# Patient Record
Sex: Male | Born: 1995 | Race: White | Hispanic: No | Marital: Single
Health system: Southern US, Community
[De-identification: ages and names within clinical notes are randomized; demographics above are authoritative.]

## PROBLEM LIST (undated history)

## (undated) DIAGNOSIS — F32A Depression, unspecified: Secondary | ICD-10-CM

## (undated) DIAGNOSIS — F419 Anxiety disorder, unspecified: Secondary | ICD-10-CM

## (undated) DIAGNOSIS — F329 Major depressive disorder, single episode, unspecified: Secondary | ICD-10-CM

---

## 2017-05-25 ENCOUNTER — Emergency Department
Admission: EM | Admit: 2017-05-25 | Discharge: 2017-05-25 | Disposition: A | Discharge status: Home / Self Care | Payer: BLUE CROSS/BLUE SHIELD | Attending: Emergency Medicine | Admitting: Emergency Medicine

## 2017-05-25 ENCOUNTER — Emergency Department: Payer: BLUE CROSS/BLUE SHIELD

## 2017-05-25 DIAGNOSIS — R0789 Other chest pain: Secondary | ICD-10-CM | POA: Insufficient documentation

## 2017-05-25 DIAGNOSIS — R079 Chest pain, unspecified: Secondary | ICD-10-CM | POA: Diagnosis present

## 2017-05-25 DIAGNOSIS — F419 Anxiety disorder, unspecified: Secondary | ICD-10-CM

## 2017-05-25 HISTORY — DX: Depression, unspecified: F32.A

## 2017-05-25 HISTORY — DX: Major depressive disorder, single episode, unspecified: F32.9

## 2017-05-25 HISTORY — DX: Anxiety disorder, unspecified: F41.9

## 2017-05-25 LAB — BASIC METABOLIC PANEL
Anion gap: 13 (ref 5–15)
BUN: 17 mg/dL (ref 6–20)
CALCIUM: 9.8 mg/dL (ref 8.9–10.3)
CHLORIDE: 104 mmol/L (ref 101–111)
CO2: 22 mmol/L (ref 22–32)
CREATININE: 0.95 mg/dL (ref 0.61–1.24)
Glucose, Bld: 104 mg/dL — ABNORMAL HIGH (ref 65–99)
Potassium: 3.4 mmol/L — ABNORMAL LOW (ref 3.5–5.1)
SODIUM: 139 mmol/L (ref 135–145)

## 2017-05-25 LAB — CBC
HCT: 45.2 % (ref 40.0–52.0)
Hemoglobin: 15.5 g/dL (ref 13.0–18.0)
MCH: 31.7 pg (ref 26.0–34.0)
MCHC: 34.2 g/dL (ref 32.0–36.0)
MCV: 92.7 fL (ref 80.0–100.0)
PLATELETS: 274 10*3/uL (ref 150–440)
RBC: 4.88 MIL/uL (ref 4.40–5.90)
RDW: 12.6 % (ref 11.5–14.5)
WBC: 12.5 10*3/uL — AB (ref 3.8–10.6)

## 2017-05-25 LAB — TROPONIN I

## 2017-05-25 MED ORDER — LORAZEPAM 2 MG/ML IJ SOLN
1.0000 mg | Freq: Once | INTRAMUSCULAR | Status: AC
  Administered 2017-05-25: 1 mg via INTRAMUSCULAR
  Filled 2017-05-25: qty 1

## 2017-05-25 NOTE — ED Notes (Signed)
Patient transported to X-ray 

## 2017-05-25 NOTE — ED Notes (Signed)
ED Provider at bedside. 

## 2017-05-25 NOTE — ED Provider Notes (Addendum)
[facility] Emergency Department Provider Note  ____________________________________________   I have reviewed the triage vital signs and the nursing notes. Where available I have reviewed prior notes and, if possible and indicated, outside hospital notes.    HISTORY  Chief Complaint Chest Pain    HPI Zachary Dominguez is a 22 y.o. male who is healthy, he has a history of "severe panic attacks" his father has a mechanical valve otherwise has no known inheritable diseases, he has no history in himself or his family of PE or DVT, he states that he was thinking about a new job he is trying to get, and he became very anxious.  He states he began to have tingling in both arms, around his lips, headache, chest pressure and he became more and more miserable.  He states he was very very stressed and upset.  Upon arrival, I did give him Ativan, and he states now all of his symptoms are completely gone "after that medication".  He denies any focal weakness.  It was not the worst headache of life.  The chest pressure similar to what he said with multiple different panic attacks.  All of his symptoms are like a normal panic attack "only worse".  Patient does take BuSpar for his symptoms, he has been compliant with his medications he does see a Veterinary surgeon.  He has no SI he has no HI, after Ativan he has absolutely no symptoms and wants to go home he is laughing and joking with his friends Past Medical History:  Diagnosis Date  . Anxiety   . Depression     There are no active problems to display for this patient.    Prior to Admission medications   Not on File    Allergies Patient has no known allergies.  No family history on file.  Social History Social History   Tobacco Use  . Smoking status: Not on file  Substance Use Topics  . Alcohol use: Not on file  . Drug use: Not on file    Review of Systems Constitutional: No fever/chills Eyes: No visual changes. ENT: No  sore throat. No stiff neck no neck pain Cardiovascular: Feeling nonradiating chest discomfort during his panic attack he states Respiratory: Was feeling short of breath he states during his panic attack he states Gastrointestinal:   no vomiting.  No diarrhea.  No constipation. Genitourinary: Negative for dysuria. Musculoskeletal: Negative lower extremity swelling Skin: Negative for rash. Neurological: Negative for severe headaches, had diffuse numbness but no weakness during his panic attack he states   ____________________________________________   PHYSICAL EXAM:  VITAL SIGNS: ED Triage Vitals [05/25/17 1510]  Enc Vitals Group     BP (!) 122/91     Pulse Rate (!) 105     Resp (!) 22     Temp 97.9 F (36.6 C)     Temp Source Oral     SpO2 97 %     Weight 195 lb (88.5 kg)     Height 6\' 1"  (1.854 m)     Head Circumference      Peak Flow      Pain Score 5     Pain Loc      Pain Edu?      Excl. in GC?     Constitutional: Initially, patient was very very anxious and upset, clearly very full of anxiety however after Ativan he is calm now and alert and oriented. Well appearing and in no acute distress. Eyes:  Conjunctivae are normal Head: Atraumatic HEENT: No congestion/rhinnorhea. Mucous membranes are moist.  Oropharynx non-erythematous Neck:   Nontender with no meningismus, no masses, no stridor Cardiovascular: Normal rate, regular rhythm. Grossly normal heart sounds.  Good peripheral circulation. Respiratory: Normal respiratory effort.  No retractions. Lungs CTAB. Abdominal: Soft and nontender. No distention. No guarding no rebound Back:  There is no focal tenderness or step off.  there is no midline tenderness there are no lesions noted. there is no CVA tenderness Musculoskeletal: No lower extremity tenderness, no upper extremity tenderness. No joint effusions, no DVT signs strong distal pulses no edema Neurologic: Cranial nerves II through XII are grossly intact 5 out of 5  strength bilateral upper and lower extremity. Finger to nose within normal limits heel to shin within normal limits, speech is normal with no word finding difficulty or dysarthria, reflexes symmetric, pupils are equally round and reactive to light, there is no pronator drift, sensation is normal, vision is intact to confrontation, gait is deferred, there is no nystagmus, normal neurologic exam Skin:  Skin is warm, dry and intact. No rash noted. Psychiatric: Mood and affect are socially very stressed but now calm. Speech and behavior are normal.  ____________________________________________   LABS (all labs ordered are listed, but only abnormal results are displayed)  Labs Reviewed  BASIC METABOLIC PANEL - Abnormal; Notable for the following components:      Result Value   Potassium 3.4 (*)    Glucose, Bld 104 (*)    All other components within normal limits  CBC - Abnormal; Notable for the following components:   WBC 12.5 (*)    All other components within normal limits  TROPONIN I    Pertinent labs  results that were available during my care of the patient were reviewed by me and considered in my medical decision making (see chart for details). ____________________________________________  EKG  I personally interpreted any EKGs ordered by me or triage Sinus tach, rate 109 no acute ST elevation or depression, no acute ischemic changes, normal axis ____________________________________________  RADIOLOGY  Pertinent labs & imaging results that were available during my care of the patient were reviewed by me and considered in my medical decision making (see chart for details). If possible, patient and/or family made aware of any abnormal findings.  No results found. ____________________________________________    PROCEDURES  Procedure(s) performed: None  Procedures  Critical Care performed: None  ____________________________________________   INITIAL IMPRESSION /  ASSESSMENT AND PLAN / ED COURSE  Pertinent labs & imaging results that were available during my care of the patient were reviewed by me and considered in my medical decision making (see chart for details).  Patient here with all the signs and symptoms of his routine panic attack, he is anxious about his job.  He has had multiple panic attacks.  He is on chronic panic attack medication.  After medication specifically tailored for a panic attack, all of his symptoms completely went away.  Although I considered I do not feel  At this time, there does not appear to be clinical evidence to support the diagnosis of pulmonary embolus, dissection, myocarditis, endocarditis, pericarditis, pericardial tamponade, acute coronary syndrome, pneumothorax, pneumonia, or any other acute intrathoracic pathology that will require admission or acute intervention. Nor is there evidence of any significant intra-abdominal pathology causing this discomfort.   Pt remains at neurologic baseline. , there is nothing to suggest or support the diagnosis of subarachnoid hemorrhage, aneurysmal event, meningitis, tumor or mass, cavernous  thrombosis, encephalitis, ischemic stroke, pseudotumor cerebri, glaucoma, temporal arteritis, or any other acute intracrania/neurological process. Extensive return precautions including but not limited to any new or worrisome symptoms such as worsening of, or change in, headache, any neurological symptoms, fever etc.. Natural disease course discussed with patient. The need for follow-up and all of my customary return precautions have been discussed as well.  Patient is encouraged to see his counselor for his anxiety attacks which she now believes this was.   ----------------------------------------- 5:02 PM on 05/25/2017 ----------------------------------------- Patient is requesting discharge at this time, he has no signs or symptoms of ongoing disease, nor did he have any significant apology  evident at any time during his stay here.  He declines any further workup, he wants the IV out he wants to go.  I do not think this is unreasonable, extensive return precautions follow-up given and understood      ____________________________________________   FINAL CLINICAL IMPRESSION(S) / ED DIAGNOSES  Final diagnoses:  None      This chart was dictated using voice recognition software.  Despite best efforts to proofread,  errors can occur which can change meaning.      Jeanmarie Plant, MD 05/25/17 1626    Jeanmarie Plant, MD 05/25/17 [phone]

## 2017-05-25 NOTE — ED Triage Notes (Signed)
Pt presents via EMS c/o chest pain, headache, and left arm numbness following working out at gym. Pt has hx of anxiety and depression per EMS. Pt A&Ox4.

## 2018-12-24 ENCOUNTER — Other Ambulatory Visit: Payer: Self-pay

## 2018-12-24 DIAGNOSIS — Z20822 Contact with and (suspected) exposure to covid-19: Secondary | ICD-10-CM

## 2018-12-26 LAB — NOVEL CORONAVIRUS, NAA: SARS-CoV-2, NAA: NOT DETECTED

## 2019-01-02 ENCOUNTER — Ambulatory Visit: Payer: Self-pay

## 2019-01-02 NOTE — Telephone Encounter (Signed)
Provided Covid -19 results to Patient .   Voiced understanding.

## 2019-07-08 IMAGING — CR DG CHEST 2V
2 series · 2 of 2 positions shown · non-contrast
Comparison: None.

CLINICAL DATA: c/o chest pain, headache, and left arm numbness
following working out at gym

Non smoker
EXAM:
CHEST - 2 VIEW

[chest pa]
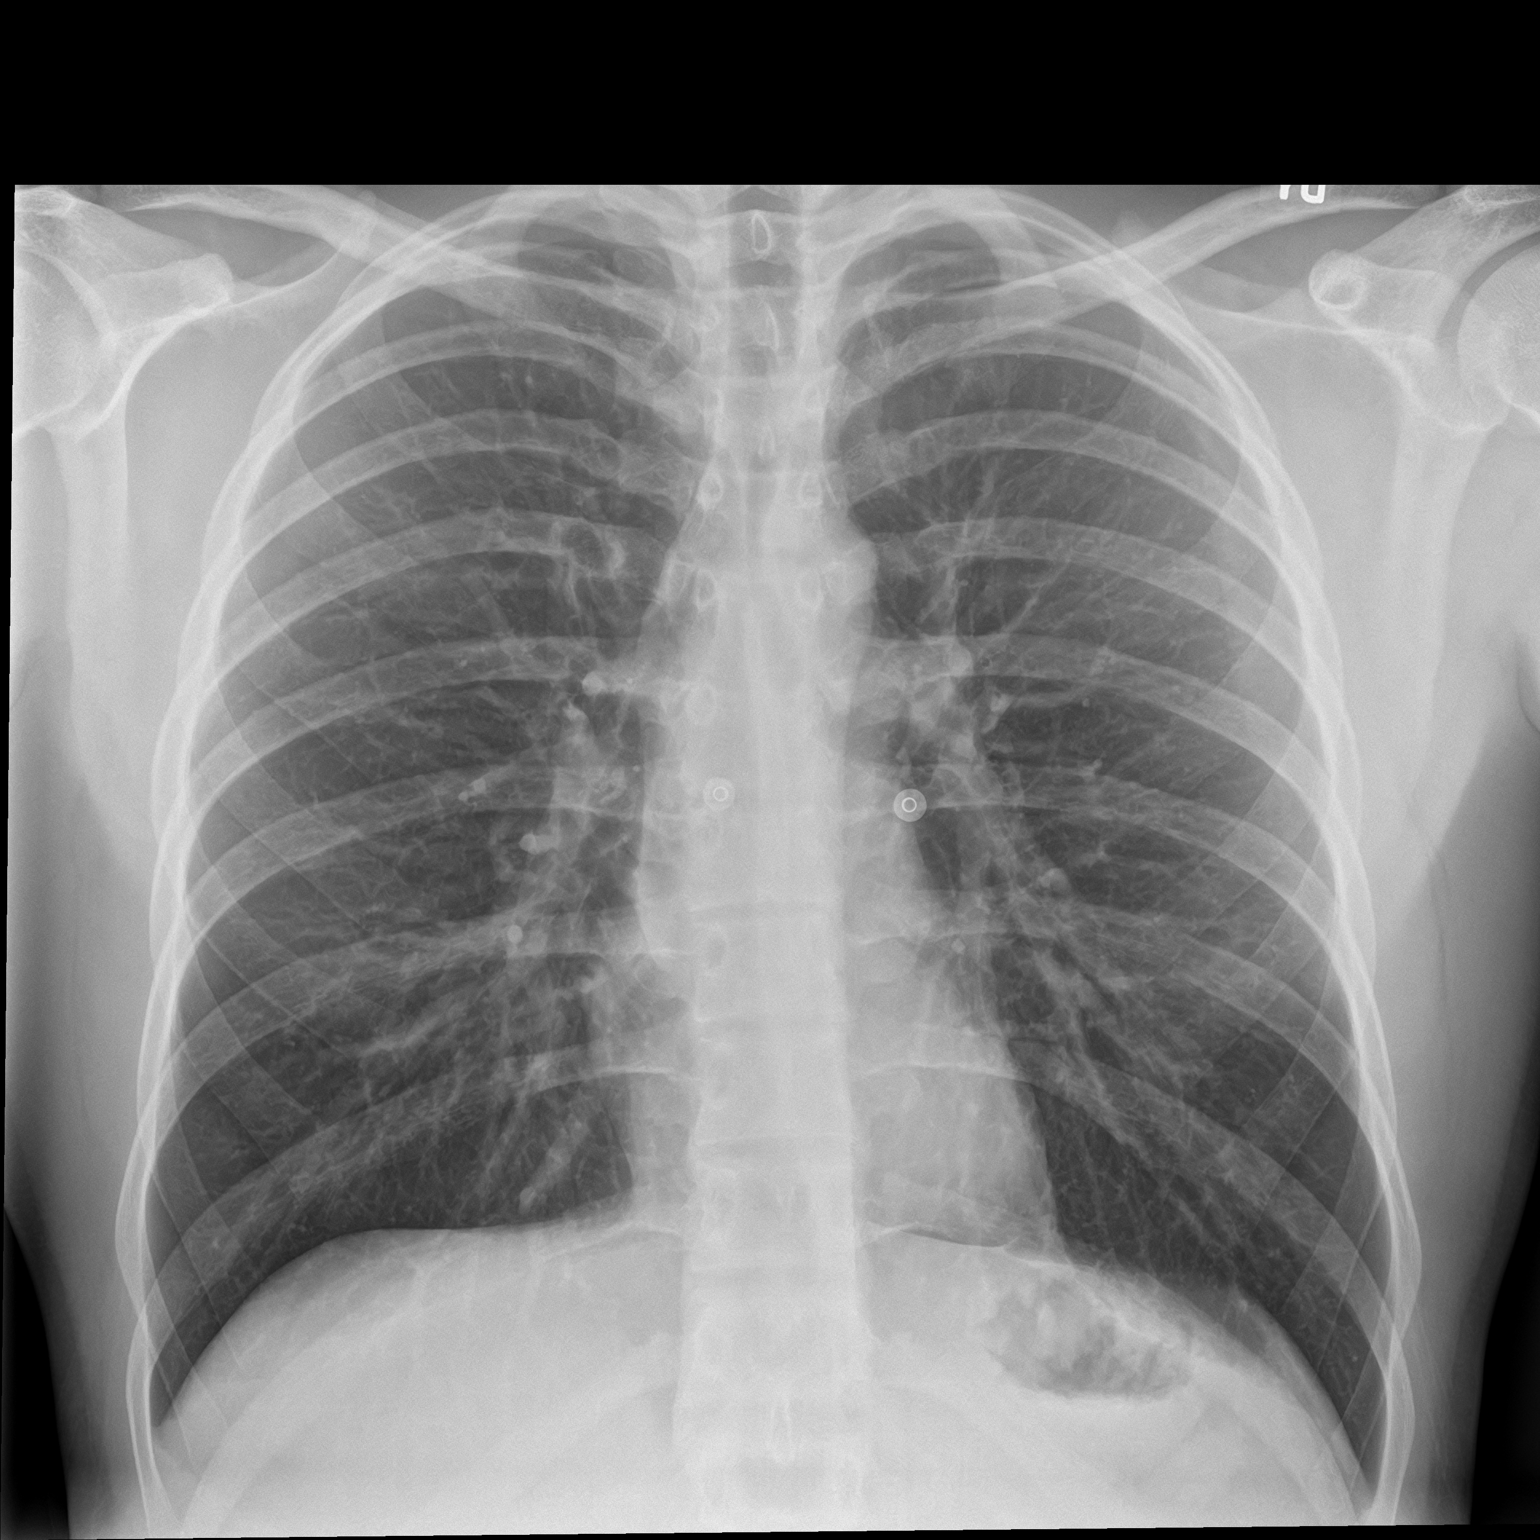

[chest lat]
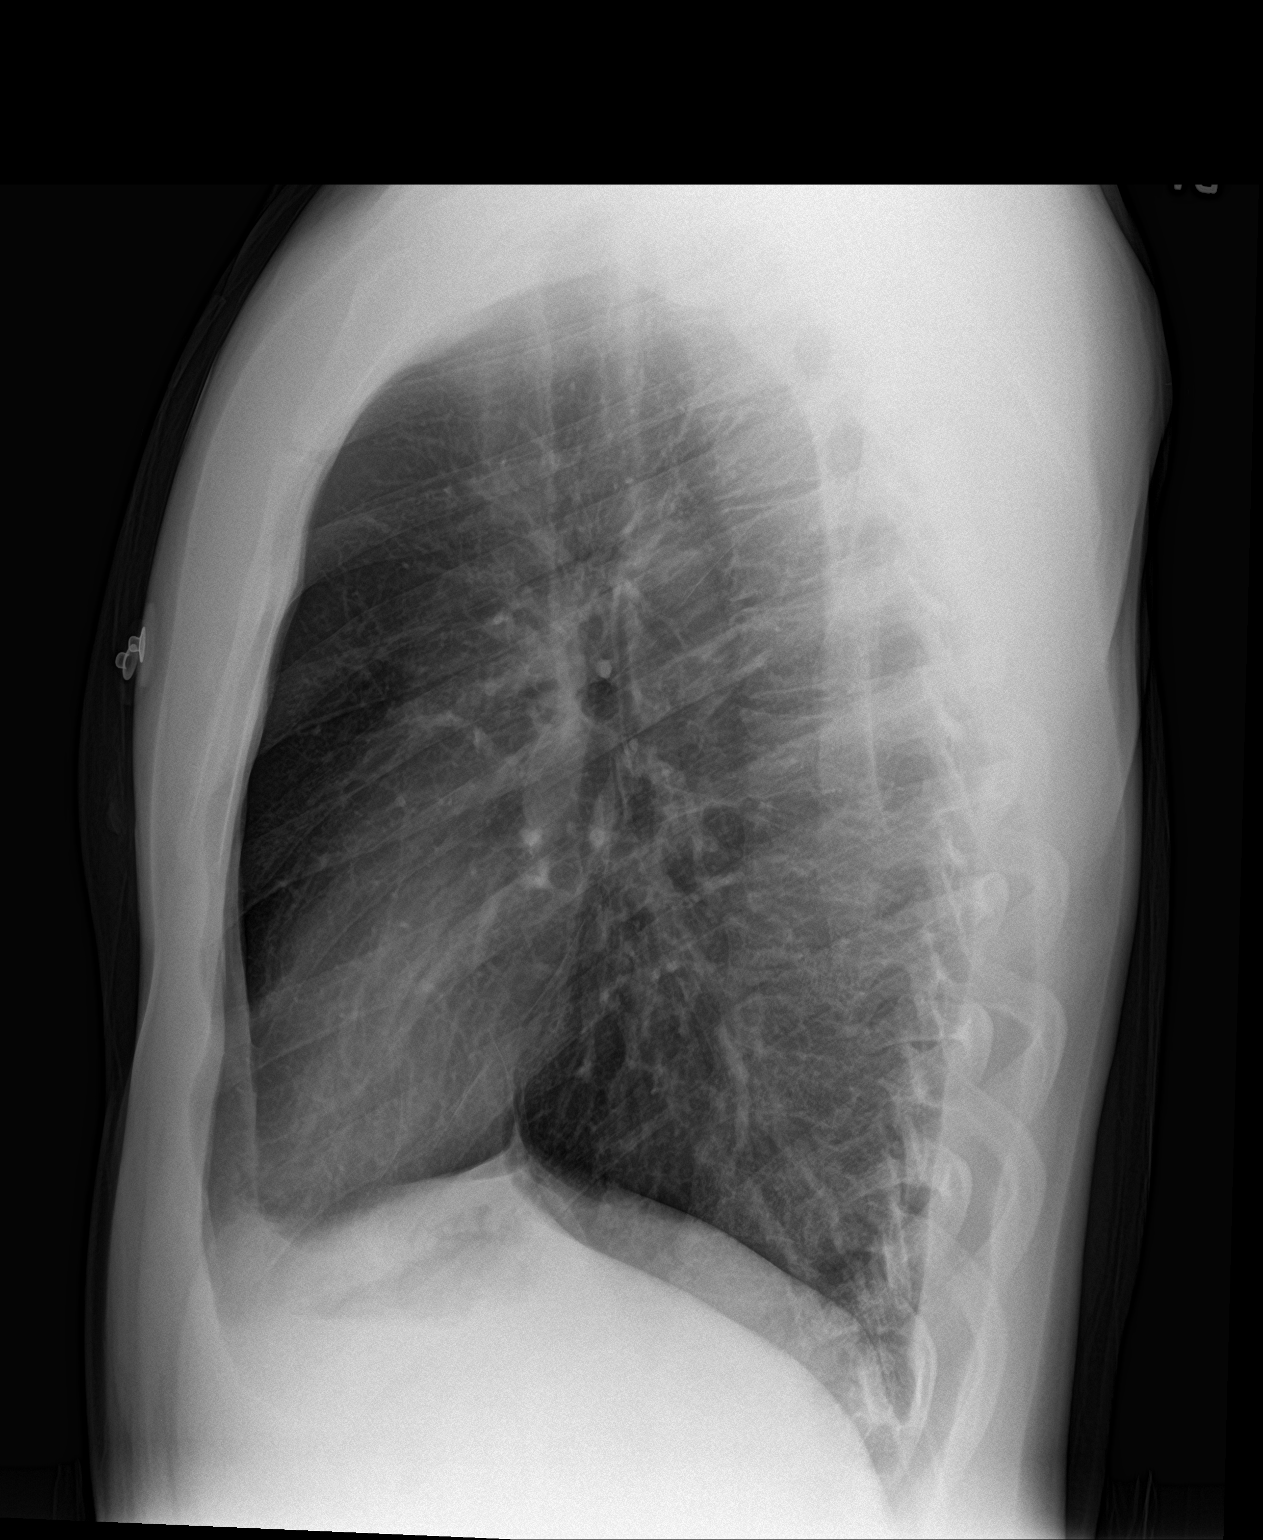

[2 of 2 positions shown; findings below may reference images not displayed]

FINDINGS: The heart size and mediastinal contours are within normal limits.
Both lungs are clear. No pleural effusion or pneumothorax. The
visualized skeletal structures are unremarkable.
IMPRESSION: No active cardiopulmonary disease.
